# Patient Record
Sex: Female | Born: 1960 | Race: White | Hispanic: No | Marital: Married | Smoking: Never smoker
Health system: Southern US, Community
[De-identification: ages and names within clinical notes are randomized; demographics above are authoritative.]

## PROBLEM LIST (undated history)

## (undated) DIAGNOSIS — F329 Major depressive disorder, single episode, unspecified: Secondary | ICD-10-CM

## (undated) DIAGNOSIS — E079 Disorder of thyroid, unspecified: Secondary | ICD-10-CM

## (undated) DIAGNOSIS — F32A Depression, unspecified: Secondary | ICD-10-CM

## (undated) HISTORY — PX: ABDOMINAL HYSTERECTOMY: SHX81

---

## 2018-09-18 ENCOUNTER — Emergency Department (HOSPITAL_BASED_OUTPATIENT_CLINIC_OR_DEPARTMENT_OTHER): Payer: BLUE CROSS/BLUE SHIELD

## 2018-09-18 ENCOUNTER — Emergency Department (HOSPITAL_BASED_OUTPATIENT_CLINIC_OR_DEPARTMENT_OTHER)
Admission: EM | Admit: 2018-09-18 | Discharge: 2018-09-19 | Disposition: A | Discharge status: Home / Self Care | Payer: BLUE CROSS/BLUE SHIELD | Attending: Emergency Medicine | Admitting: Emergency Medicine

## 2018-09-18 ENCOUNTER — Other Ambulatory Visit: Payer: Self-pay

## 2018-09-18 ENCOUNTER — Encounter (HOSPITAL_BASED_OUTPATIENT_CLINIC_OR_DEPARTMENT_OTHER): Payer: Self-pay | Admitting: *Deleted

## 2018-09-18 DIAGNOSIS — X509XXA Other and unspecified overexertion or strenuous movements or postures, initial encounter: Secondary | ICD-10-CM | POA: Insufficient documentation

## 2018-09-18 DIAGNOSIS — Y9239 Other specified sports and athletic area as the place of occurrence of the external cause: Secondary | ICD-10-CM | POA: Insufficient documentation

## 2018-09-18 DIAGNOSIS — Z79899 Other long term (current) drug therapy: Secondary | ICD-10-CM | POA: Insufficient documentation

## 2018-09-18 DIAGNOSIS — M7062 Trochanteric bursitis, left hip: Secondary | ICD-10-CM | POA: Diagnosis not present

## 2018-09-18 DIAGNOSIS — Y999 Unspecified external cause status: Secondary | ICD-10-CM | POA: Diagnosis not present

## 2018-09-18 DIAGNOSIS — S76012A Strain of muscle, fascia and tendon of left hip, initial encounter: Secondary | ICD-10-CM

## 2018-09-18 DIAGNOSIS — Y9354 Activity, bowling: Secondary | ICD-10-CM | POA: Diagnosis not present

## 2018-09-18 DIAGNOSIS — E079 Disorder of thyroid, unspecified: Secondary | ICD-10-CM | POA: Diagnosis not present

## 2018-09-18 DIAGNOSIS — S79912A Unspecified injury of left hip, initial encounter: Secondary | ICD-10-CM | POA: Diagnosis present

## 2018-09-18 HISTORY — DX: Major depressive disorder, single episode, unspecified: F32.9

## 2018-09-18 HISTORY — DX: Disorder of thyroid, unspecified: E07.9

## 2018-09-18 HISTORY — DX: Depression, unspecified: F32.A

## 2018-09-18 NOTE — ED Triage Notes (Signed)
Left hip pain. She was bowling and felt a pop.

## 2018-09-19 MED ORDER — KETOROLAC TROMETHAMINE 60 MG/2ML IM SOLN
INTRAMUSCULAR | Status: AC
  Filled 2018-09-19: qty 2

## 2018-09-19 MED ORDER — NAPROXEN 375 MG PO TABS: 375.0000 mg | ORAL_TABLET | Freq: Two times a day (BID) | ORAL | 0 refills | Status: AC

## 2018-09-19 MED ORDER — KETOROLAC TROMETHAMINE 60 MG/2ML IM SOLN
60.0000 mg | Freq: Once | INTRAMUSCULAR | Status: AC
  Administered 2018-09-19: 60 mg via INTRAMUSCULAR

## 2018-09-19 NOTE — ED Provider Notes (Signed)
[facility] EMERGENCY DEPARTMENT Provider Note   CSN: [id number] Arrival date & time: 09/18/18  2243     History   Chief Complaint Chief Complaint  Patient presents with  . Hip Pain    HPI Melissa Friedman is a 57 y.o. female.  HPI 57 year old female here with left hip pain.  Patient was bowling today when she experienced acute onset of a sharp, positional, left hip pain.  The patient states that she had some mild left hip pain during bowling but no severe pain.  She went to throw the ball and felt a popping sensation.  Since then, she has had an aching, throbbing, lateral left hip pain that is worse with palpation and weightbearing.  Denies any history of previous hip injuries.  She has been able to walk on it.  Denies any distal numbness or weakness.  No back pain.  No fevers or chills.  No recent falls or trauma.  Symptoms are relieved with rest.   Past Medical History:  Diagnosis Date  . Depression   . Thyroid disease     There are no active problems to display for this patient.   Past Surgical History:  Procedure Laterality Date  . ABDOMINAL HYSTERECTOMY    . CESAREAN SECTION       OB History   None      Home Medications    Prior to Admission medications   Medication Sig Start Date End Date Taking? Authorizing Provider  Levothyroxine Sodium (SYNTHROID PO) Take by mouth.   Yes [provider]  TRAZODONE HCL PO Take by mouth.   Yes [provider]  naproxen (NAPROSYN) 375 MG tablet Take 1 tablet (375 mg total) by mouth 2 (two) times daily with a meal for 7 days. 09/19/18 09/26/18  Shaune Pollack, MD    Family History No family history on file.  Social History Social History   Tobacco Use  . Smoking status: Never Smoker  . Smokeless tobacco: Never Used  Substance Use Topics  . Alcohol use: Yes  . Drug use: Never     Allergies   Patient has no known allergies.   Review of Systems Review of Systems    Constitutional: Negative for chills, fatigue and fever.  HENT: Negative for congestion and rhinorrhea.   Eyes: Negative for visual disturbance.  Respiratory: Negative for cough, shortness of breath and wheezing.   Cardiovascular: Negative for chest pain and leg swelling.  Gastrointestinal: Negative for abdominal pain, diarrhea, nausea and vomiting.  Genitourinary: Negative for dysuria and flank pain.  Musculoskeletal: Positive for arthralgias and gait problem. Negative for neck pain and neck stiffness.  Skin: Negative for rash and wound.  Allergic/Immunologic: Negative for immunocompromised state.  Neurological: Negative for syncope, weakness and headaches.  All other systems reviewed and are negative.    Physical Exam Updated Vital Signs BP 128/75 (BP Location: Right Arm)   Pulse (!) 56   Temp 97.9 F (36.6 C) (Oral)   Resp 20   Ht 5\' 2"  (1.575 m)   Wt 93 kg   SpO2 99%   BMI 37.49 kg/m   Physical Exam  Constitutional: She is oriented to person, place, and time. She appears well-developed and well-nourished. No distress.  HENT:  Head: Normocephalic and atraumatic.  Eyes: Conjunctivae are normal.  Neck: Neck supple.  Cardiovascular: Normal rate, regular rhythm and normal heart sounds.  Pulmonary/Chest: Effort normal. No respiratory distress. She has no wheezes.  Abdominal: She exhibits no distension.  Musculoskeletal:  She exhibits no edema.  Neurological: She is alert and oriented to person, place, and time. She exhibits normal muscle tone.  Skin: Skin is warm. Capillary refill takes less than 2 seconds. No rash noted.  Nursing note and vitals reviewed.   LOWER EXTREMITY EXAM: LEFT  INSPECTION & PALPATION: Significant tenderness to palpation of the left greater trochanter and trochanteric bursitis, with tenderness distal to this along the IT band.  No appreciable warmth.  No deformity.  No shortening.  SENSORY: sensation is intact to light touch in:  Superficial  peroneal nerve distribution (over dorsum of foot) Deep peroneal nerve distribution (over first dorsal web space) Sural nerve distribution (over lateral aspect 5th metatarsal) Saphenous nerve distribution (over medial instep)  MOTOR:  + Motor EHL (great toe dorsiflexion) + FHL (great toe plantar flexion)  + TA (ankle dorsiflexion)  + GSC (ankle plantar flexion)  VASCULAR: 2+ dorsalis pedis and posterior tibialis pulses Capillary refill < 2 sec, toes warm and well-perfused  COMPARTMENTS: Soft, warm, well-perfused No pain with passive extension No parethesias    ED Treatments / Results  Labs (all labs ordered are listed, but only abnormal results are displayed) Labs Reviewed - No data to display  EKG None  Radiology Dg Hip Unilat W Or Wo Pelvis 1 View Left  Result Date: 09/18/2018 CLINICAL DATA:  Pain. EXAM: DG HIP (WITH OR WITHOUT PELVIS) 1V*L* COMPARISON:  None. FINDINGS: There is no evidence of hip fracture or dislocation. There is no evidence of arthropathy or other focal bone abnormality. IMPRESSION: Negative. Electronically Signed   By: Gerome Samavid  Williams III M.D   On: 09/18/2018 23:43    Procedures Procedures (including critical care time)  Medications Ordered in ED Medications  ketorolac (TORADOL) injection 60 mg (60 mg Intramuscular Given 09/19/18 0106)     Initial Impression / Assessment and Plan / ED Course  I have reviewed the triage vital signs and the nursing notes.  Pertinent labs & imaging results that were available during my care of the patient were reviewed by me and considered in my medical decision making (see chart for details).     Very well-appearing female here with left hip pain from bowling.  I suspect she likely has a mild tendinitis and possible bursitis related to bowling.  She felt a popping sensation, but was able to immediately walk and did not fall and I do not suspect transient dislocation.  She is amatory here in the ED.  Plain films  without fracture or bony abnormality.  No fevers, infectious symptoms, or symptoms to suggest infectious etiology such as septic arthritis.  Will treat with crutches as needed, weightbearing as tolerated, and NSAIDs.  Refer to PCP for follow-up.  Final Clinical Impressions(s) / ED Diagnoses   Final diagnoses:  Trochanteric bursitis of left hip  Hip strain, left, initial encounter    ED Discharge Orders         Ordered    naproxen (NAPROSYN) 375 MG tablet  2 times daily with meals     09/19/18 0119           Shaune PollackIsaacs, Amarianna Abplanalp, MD 09/19/18 [phone]

## 2019-07-14 IMAGING — DX DG HIP (WITH OR WITHOUT PELVIS) 1V*L*
3 series · 3 of 3 positions shown · non-contrast
Comparison: None.

CLINICAL DATA: Pain.

EXAM:
DG HIP (WITH OR WITHOUT PELVIS) 1V*L*

[pelvis ap]
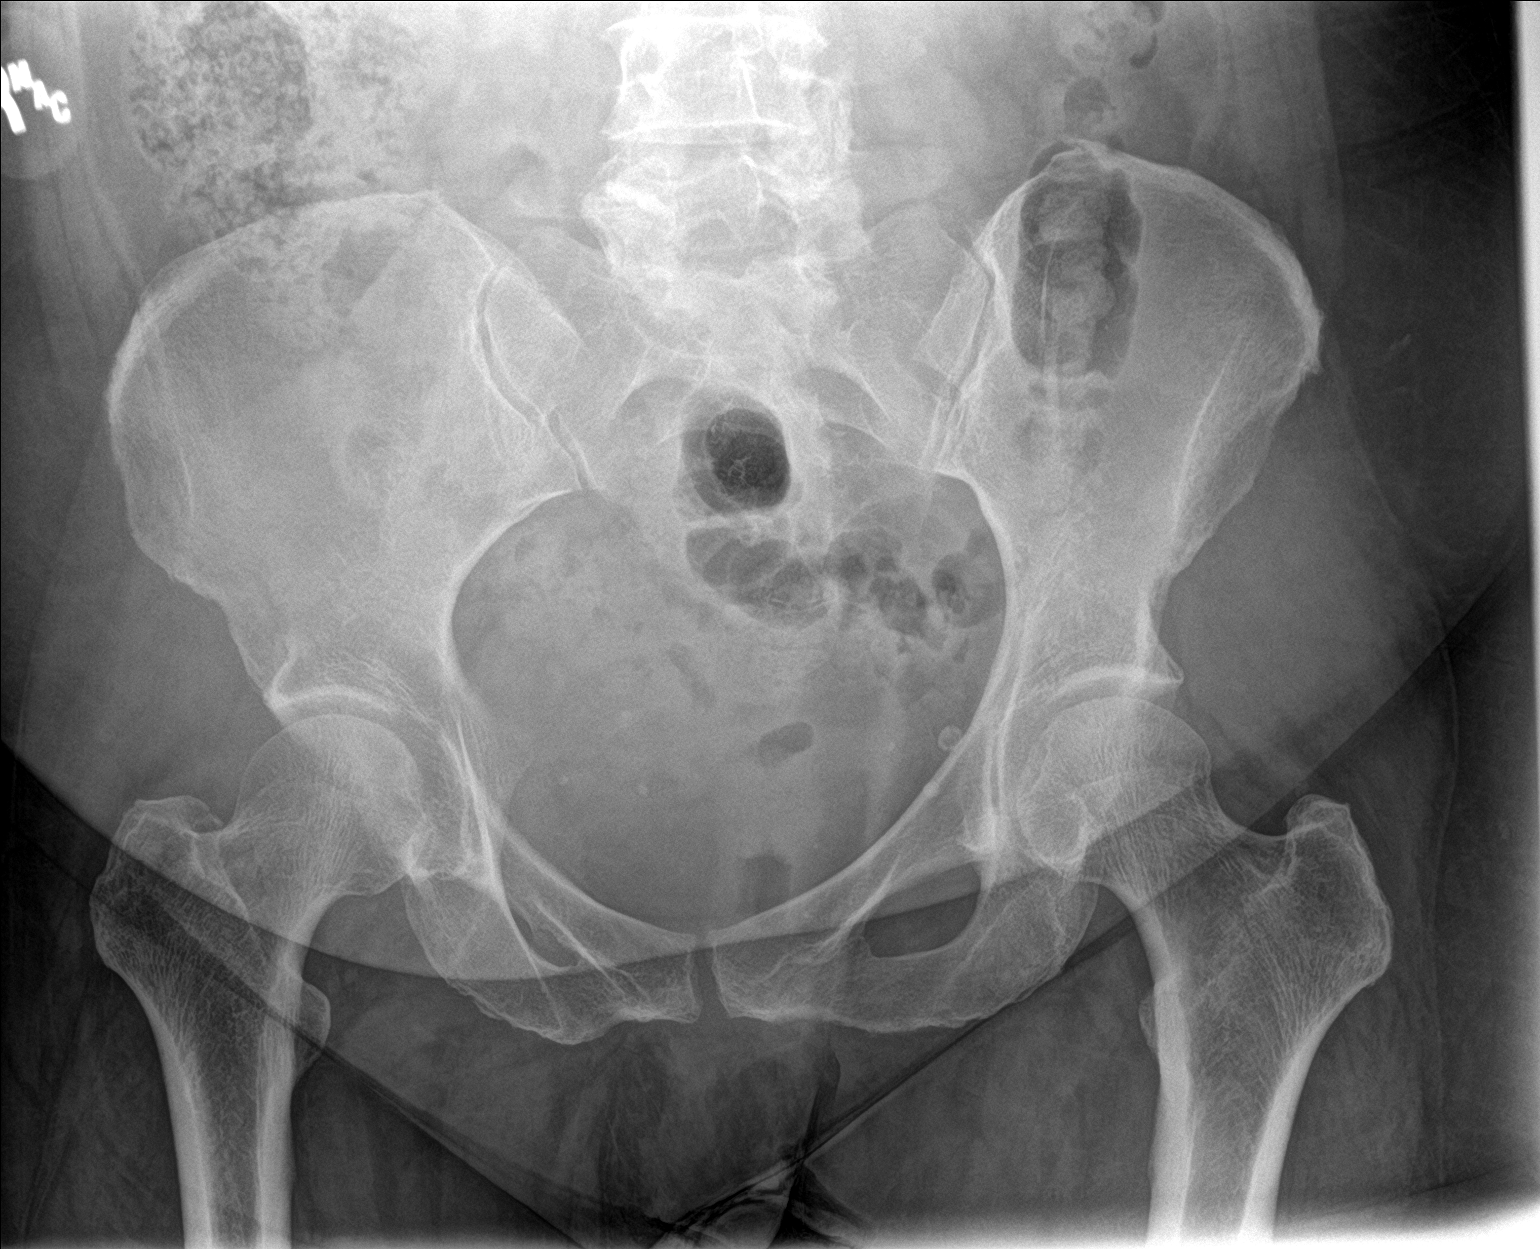

[hip ap]
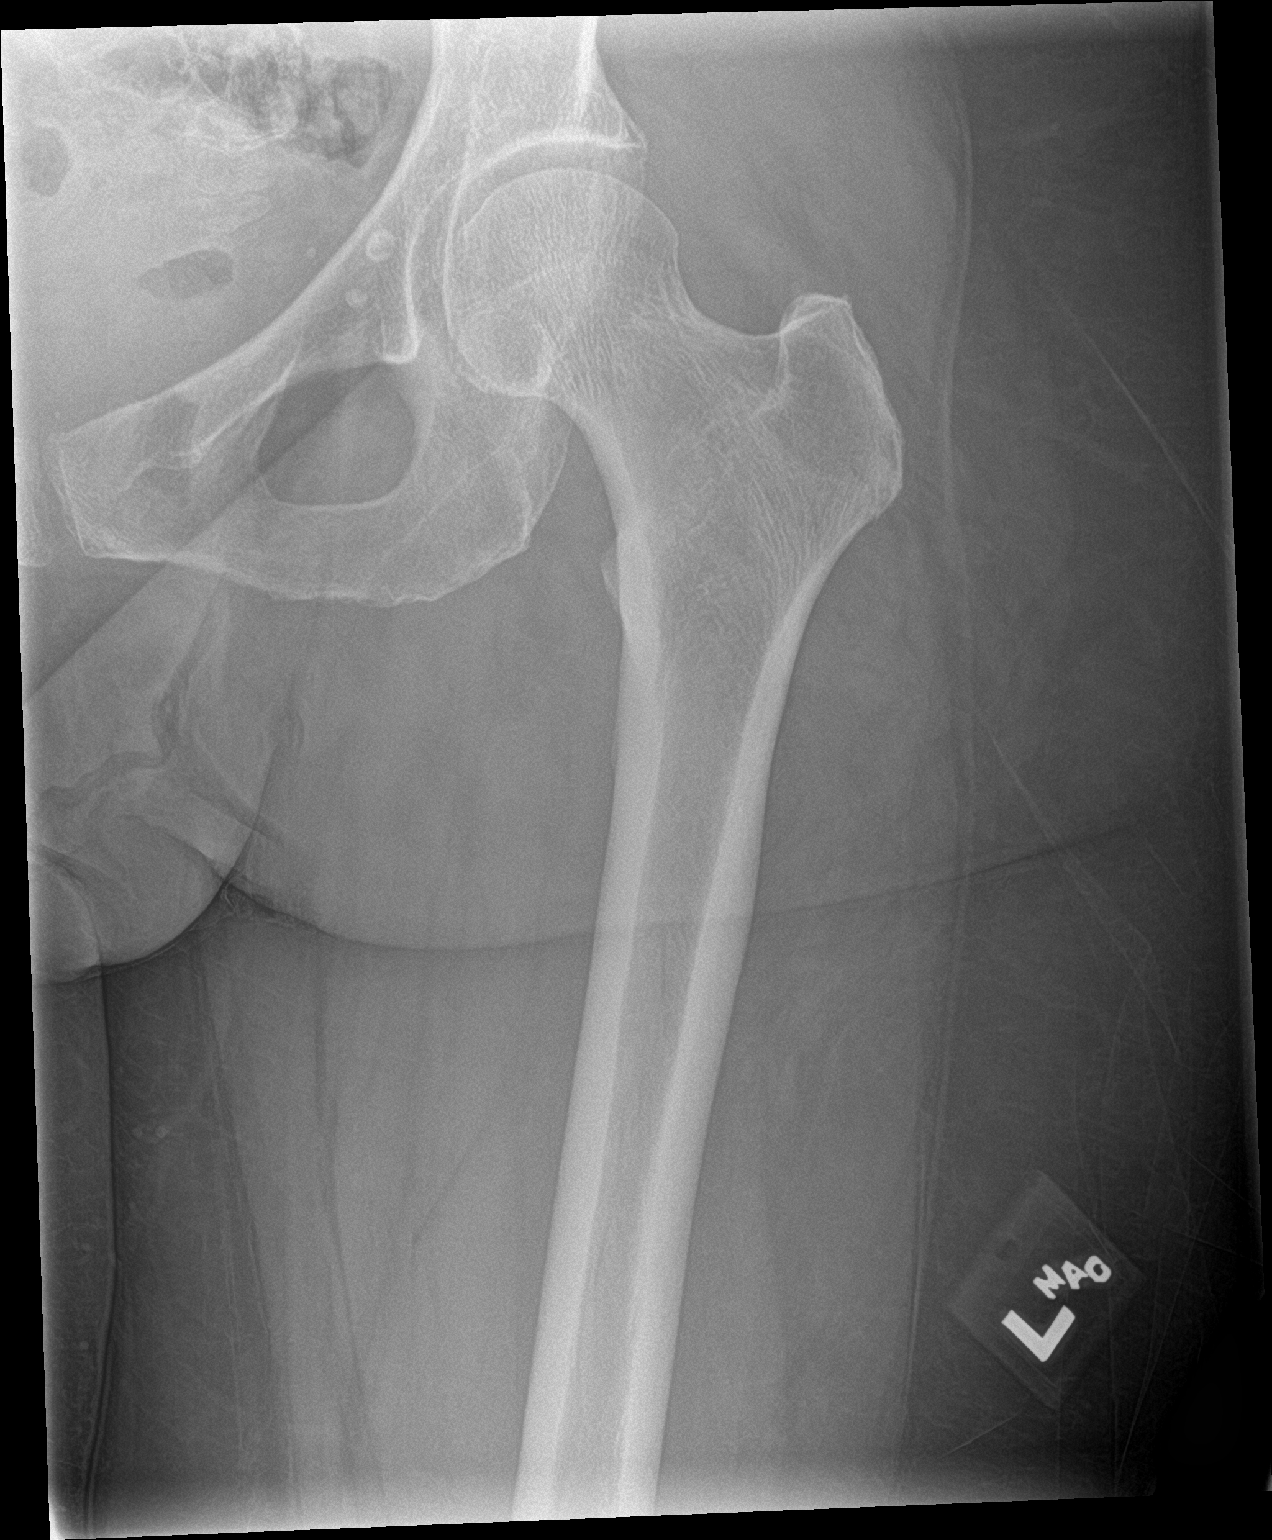

[hip lat]
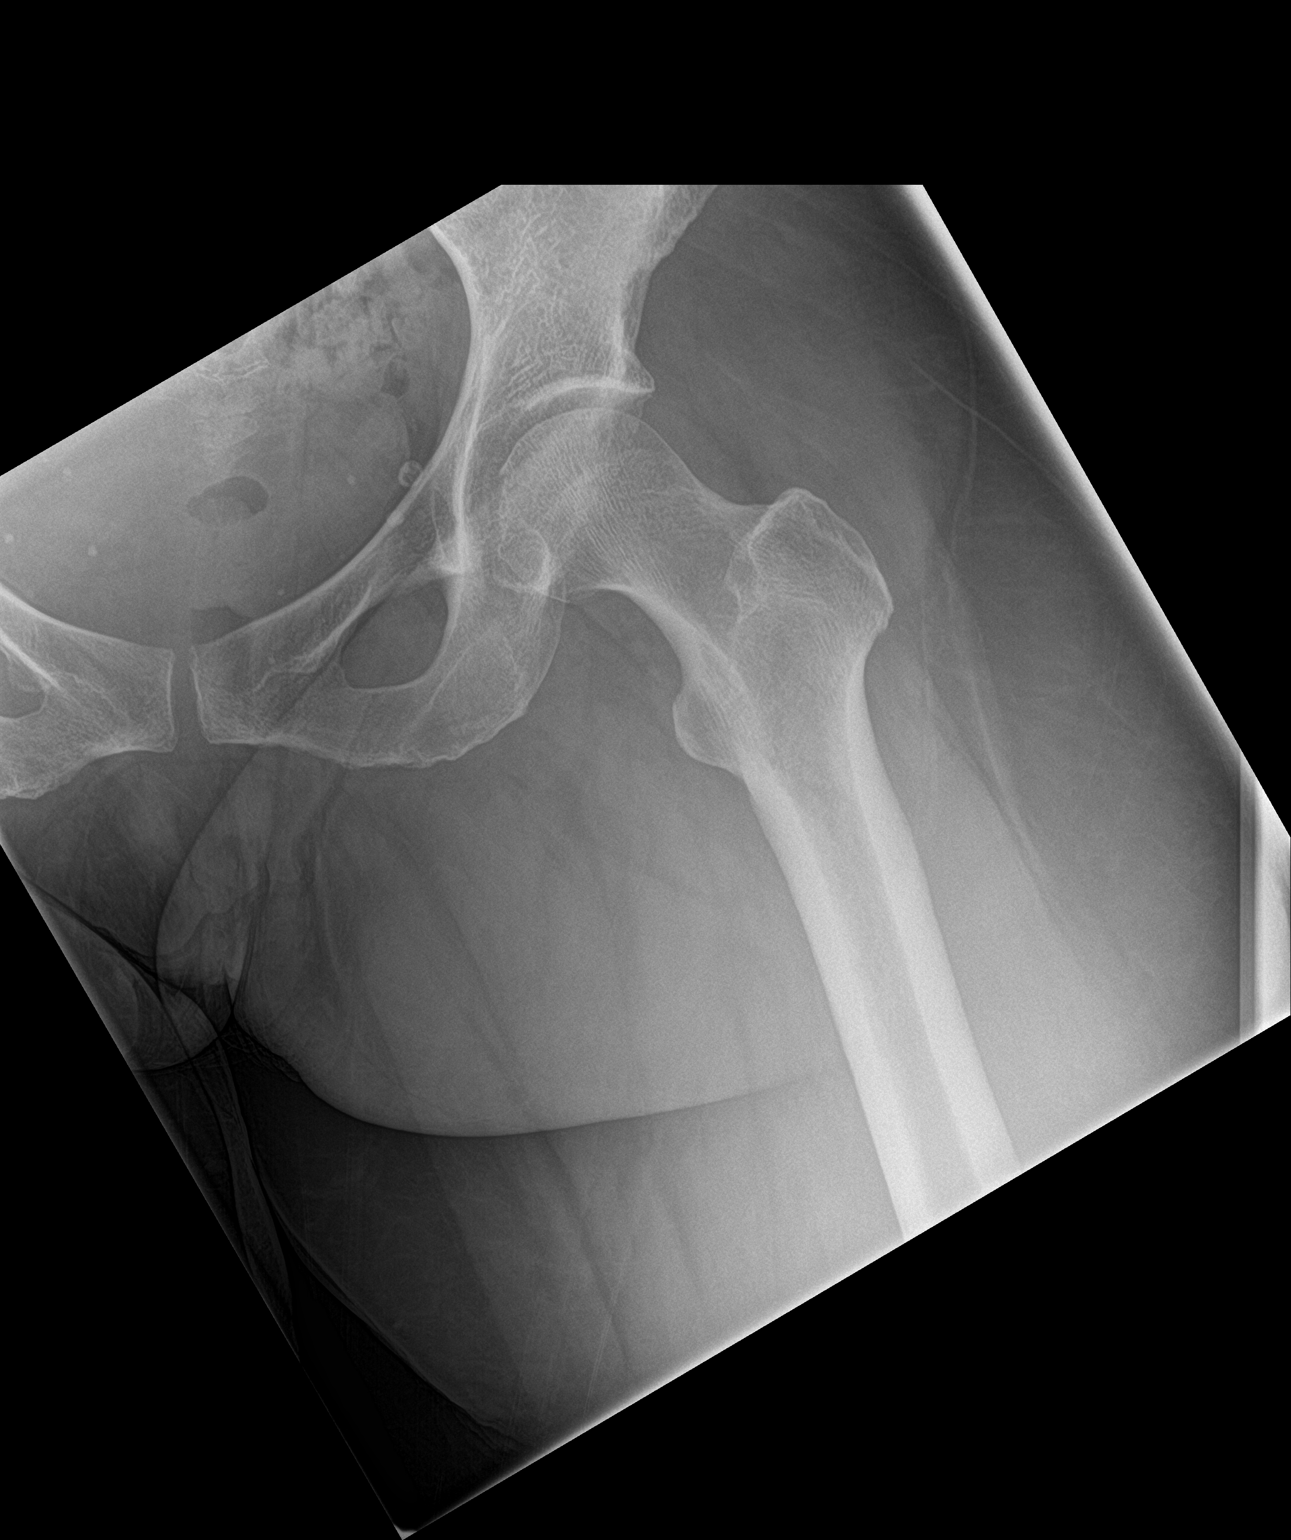

[3 of 3 positions shown; findings below may reference images not displayed]

FINDINGS: There is no evidence of hip fracture or dislocation. There is no
evidence of arthropathy or other focal bone abnormality.
IMPRESSION: Negative.
